# Patient Record
Sex: Male | Born: 1950 | Race: White | Hispanic: No | Smoking: Current every day smoker
Health system: Southern US, Community
[De-identification: ages and names within clinical notes are randomized; demographics above are authoritative.]

## PROBLEM LIST (undated history)

## (undated) DIAGNOSIS — I10 Essential (primary) hypertension: Secondary | ICD-10-CM

## (undated) HISTORY — PX: CARDIAC SURGERY: SHX584

---

## 2009-06-06 ENCOUNTER — Ambulatory Visit: Payer: Self-pay | Admitting: Diagnostic Radiology

## 2009-06-06 ENCOUNTER — Emergency Department (HOSPITAL_BASED_OUTPATIENT_CLINIC_OR_DEPARTMENT_OTHER): Admission: EM | Admit: 2009-06-06 | Discharge: 2009-06-06 | Payer: Self-pay | Admitting: Emergency Medicine

## 2017-02-18 ENCOUNTER — Encounter (HOSPITAL_COMMUNITY): Payer: Self-pay

## 2017-02-18 ENCOUNTER — Emergency Department (HOSPITAL_COMMUNITY)
Admission: EM | Admit: 2017-02-18 | Discharge: 2017-02-19 | Disposition: A | Discharge status: Home / Self Care | Payer: Medicare Other | Attending: Emergency Medicine | Admitting: Emergency Medicine

## 2017-02-18 DIAGNOSIS — I10 Essential (primary) hypertension: Secondary | ICD-10-CM | POA: Insufficient documentation

## 2017-02-18 DIAGNOSIS — F918 Other conduct disorders: Secondary | ICD-10-CM | POA: Diagnosis not present

## 2017-02-18 DIAGNOSIS — F17200 Nicotine dependence, unspecified, uncomplicated: Secondary | ICD-10-CM | POA: Diagnosis not present

## 2017-02-18 DIAGNOSIS — R4689 Other symptoms and signs involving appearance and behavior: Secondary | ICD-10-CM

## 2017-02-18 DIAGNOSIS — T40601A Poisoning by unspecified narcotics, accidental (unintentional), initial encounter: Secondary | ICD-10-CM

## 2017-02-18 DIAGNOSIS — T402X1A Poisoning by other opioids, accidental (unintentional), initial encounter: Secondary | ICD-10-CM | POA: Insufficient documentation

## 2017-02-18 DIAGNOSIS — R4182 Altered mental status, unspecified: Secondary | ICD-10-CM | POA: Diagnosis not present

## 2017-02-18 HISTORY — DX: Essential (primary) hypertension: I10

## 2017-02-18 LAB — CBC
HEMATOCRIT: 32.4 % — AB (ref 39.0–52.0)
HEMOGLOBIN: 11.1 g/dL — AB (ref 13.0–17.0)
MCH: 30.7 pg (ref 26.0–34.0)
MCHC: 34.3 g/dL (ref 30.0–36.0)
MCV: 89.5 fL (ref 78.0–100.0)
Platelets: 220 10*3/uL (ref 150–400)
RBC: 3.62 MIL/uL — ABNORMAL LOW (ref 4.22–5.81)
RDW: 14.2 % (ref 11.5–15.5)
WBC: 16.1 10*3/uL — ABNORMAL HIGH (ref 4.0–10.5)

## 2017-02-18 LAB — RAPID URINE DRUG SCREEN, HOSP PERFORMED
AMPHETAMINES: NOT DETECTED
BENZODIAZEPINES: POSITIVE — AB
Barbiturates: NOT DETECTED
Cocaine: NOT DETECTED
OPIATES: NOT DETECTED
TETRAHYDROCANNABINOL: NOT DETECTED

## 2017-02-18 LAB — COMPREHENSIVE METABOLIC PANEL
ALBUMIN: 3.7 g/dL (ref 3.5–5.0)
ALK PHOS: 71 U/L (ref 38–126)
ALT: 30 U/L (ref 17–63)
AST: 77 U/L — AB (ref 15–41)
Anion gap: 7 (ref 5–15)
BUN: 14 mg/dL (ref 6–20)
CALCIUM: 8.8 mg/dL — AB (ref 8.9–10.3)
CHLORIDE: 103 mmol/L (ref 101–111)
CO2: 27 mmol/L (ref 22–32)
CREATININE: 1.21 mg/dL (ref 0.61–1.24)
GFR calc non Af Amer: >60 mL/min (ref >60)
GLUCOSE: 107 mg/dL — AB (ref 65–99)
Potassium: 3.9 mmol/L (ref 3.5–5.1)
SODIUM: 137 mmol/L (ref 135–145)
Total Bilirubin: 0.3 mg/dL (ref 0.3–1.2)
Total Protein: 6.7 g/dL (ref 6.5–8.1)

## 2017-02-18 LAB — ACETAMINOPHEN LEVEL: Acetaminophen (Tylenol), Serum: <10 ug/mL — ABNORMAL LOW (ref 10–30)

## 2017-02-18 LAB — SALICYLATE LEVEL: Salicylate Lvl: <7 mg/dL (ref 2.8–30.0)

## 2017-02-18 LAB — ETHANOL: Alcohol, Ethyl (B): <5 mg/dL (ref <5)

## 2017-02-18 MED ORDER — NALOXONE HCL 2 MG/2ML IJ SOSY
1.0000 mg | PREFILLED_SYRINGE | Freq: Once | INTRAMUSCULAR | Status: AC
  Administered 2017-02-18: 1 mg via INTRAVENOUS
  Filled 2017-02-18: qty 2

## 2017-02-18 MED ORDER — SODIUM CHLORIDE 0.9 % IV SOLN
INTRAVENOUS | Status: DC
  Administered 2017-02-18: 20:00:00 via INTRAVENOUS
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
## 2017-02-18 NOTE — ED Notes (Signed)
Bed: [id number] Expected date:  Expected time:  Means of arrival:  Comments: 66 yo M/ OD

## 2017-02-18 NOTE — ED Notes (Signed)
Pt has become increased in sleepiness, and slightly lethargic, adm. 1 mg of narcan.

## 2017-02-18 NOTE — ED Notes (Addendum)
Pt is alert and verbally responsive, with a sluggish response. Pt is drowsy and intermittently in and out of sleep. [facility] Pt denies SI/HI and reports that he was not trying to kill himself, and that he had took an extra dose of percoet

## 2017-02-19 ENCOUNTER — Emergency Department (HOSPITAL_COMMUNITY): Payer: Medicare Other

## 2017-02-19 DIAGNOSIS — T402X1A Poisoning by other opioids, accidental (unintentional), initial encounter: Secondary | ICD-10-CM | POA: Diagnosis not present

## 2017-02-19 MED ORDER — NALOXONE HCL 2 MG/2ML IJ SOSY
2.0000 mg | PREFILLED_SYRINGE | Freq: Once | INTRAMUSCULAR | Status: AC
  Administered 2017-02-19: 2 mg via INTRAVENOUS
  Filled 2017-02-19: qty 2

## 2017-02-19 MED ORDER — NALOXONE HCL 2 MG/2ML IJ SOSY
PREFILLED_SYRINGE | INTRAMUSCULAR | Status: AC
  Filled 2017-02-19: qty 2

## 2017-02-19 NOTE — Discharge Instructions (Signed)
You are taking too much of your pain medication at one time. This is very dangerous and can cause of death. If you've run out of your pain medicine you will need to discuss this with your pain management doctor.
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
## 2018-03-27 IMAGING — CR DG CHEST 2V
2 series · 2 of 2 positions shown · non-contrast
Comparison: Chest radiograph performed 06/06/2009

CLINICAL DATA: Accidental medication overdose. Medical clearance.
Initial encounter.

EXAM:
CHEST  2 VIEW

[w chest lat]
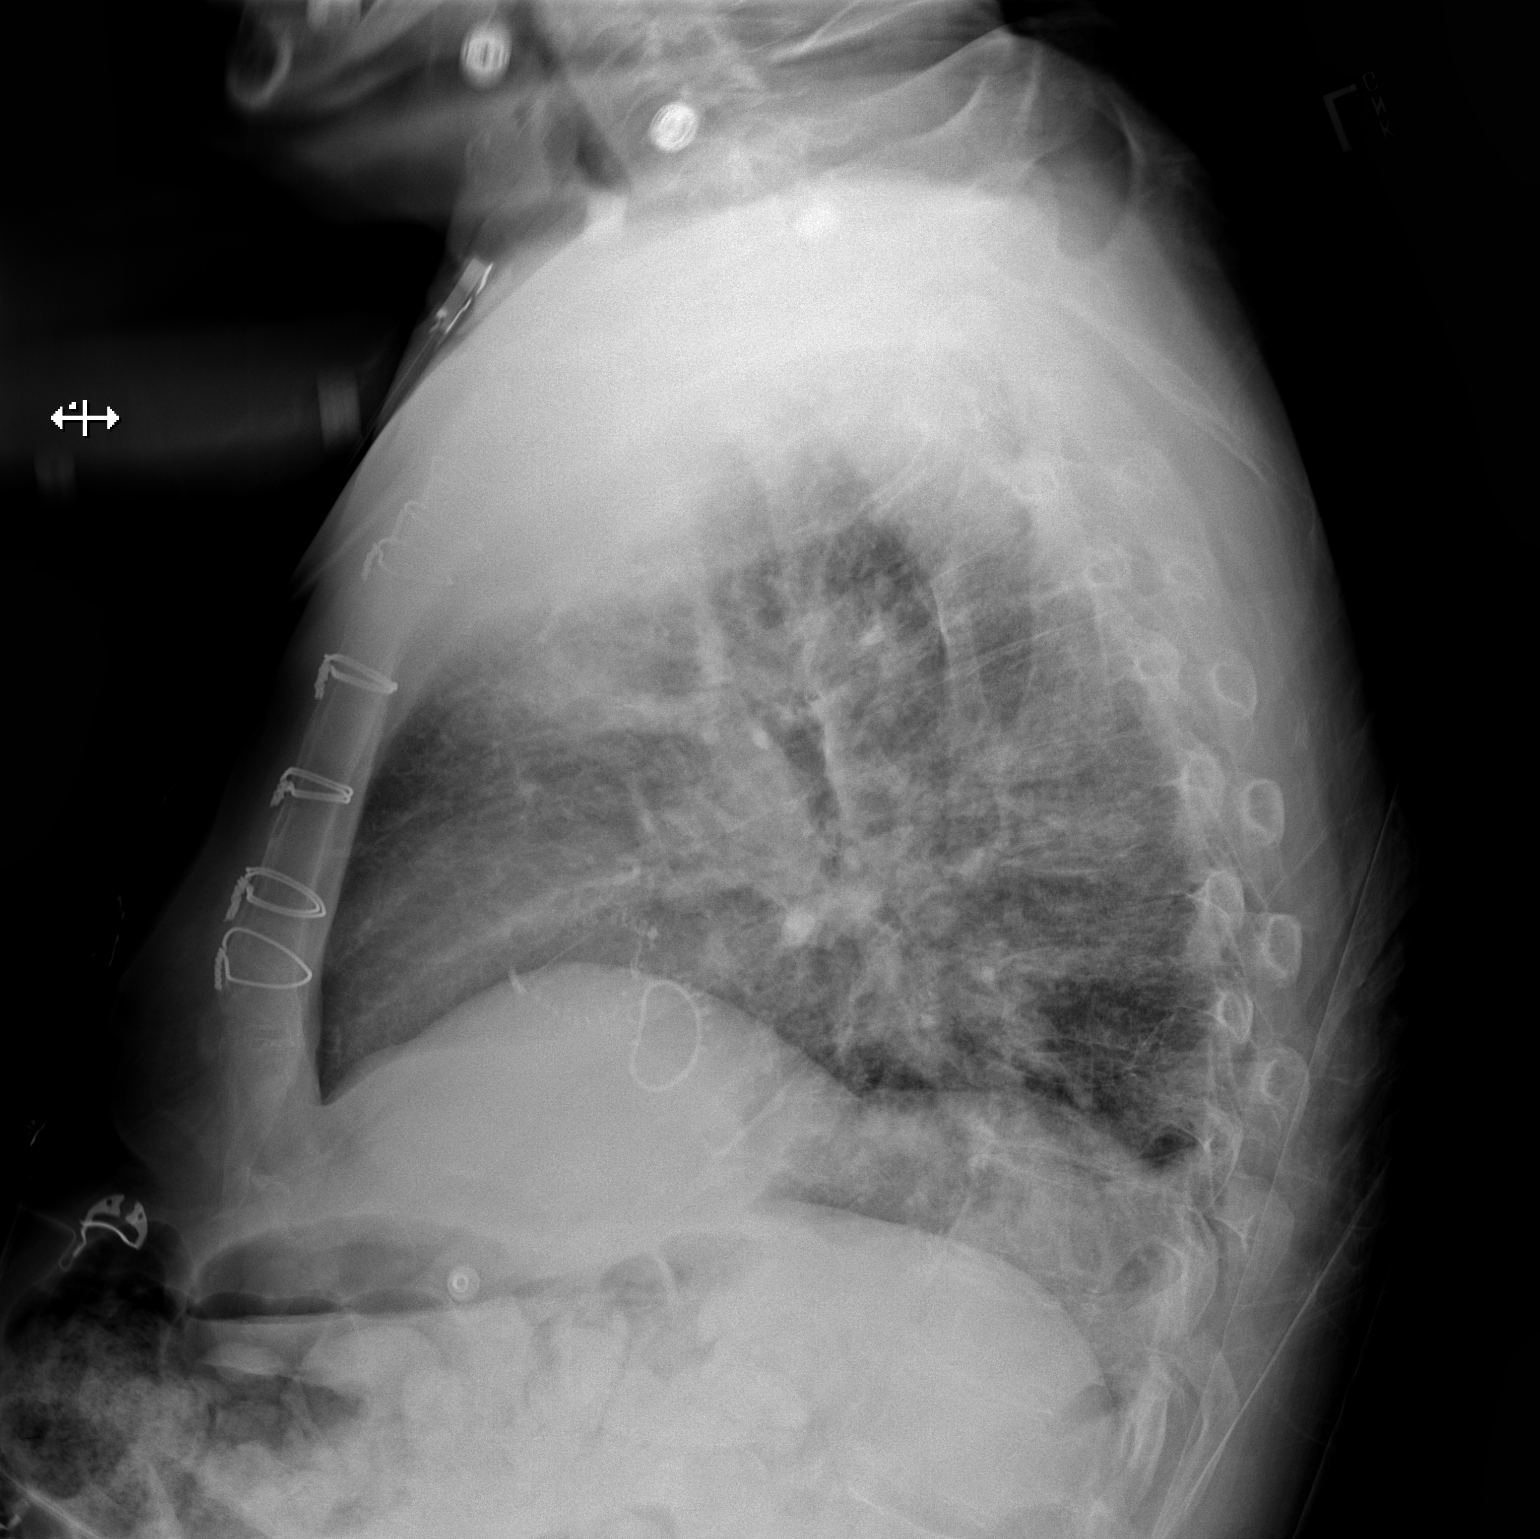

[x chest ap]
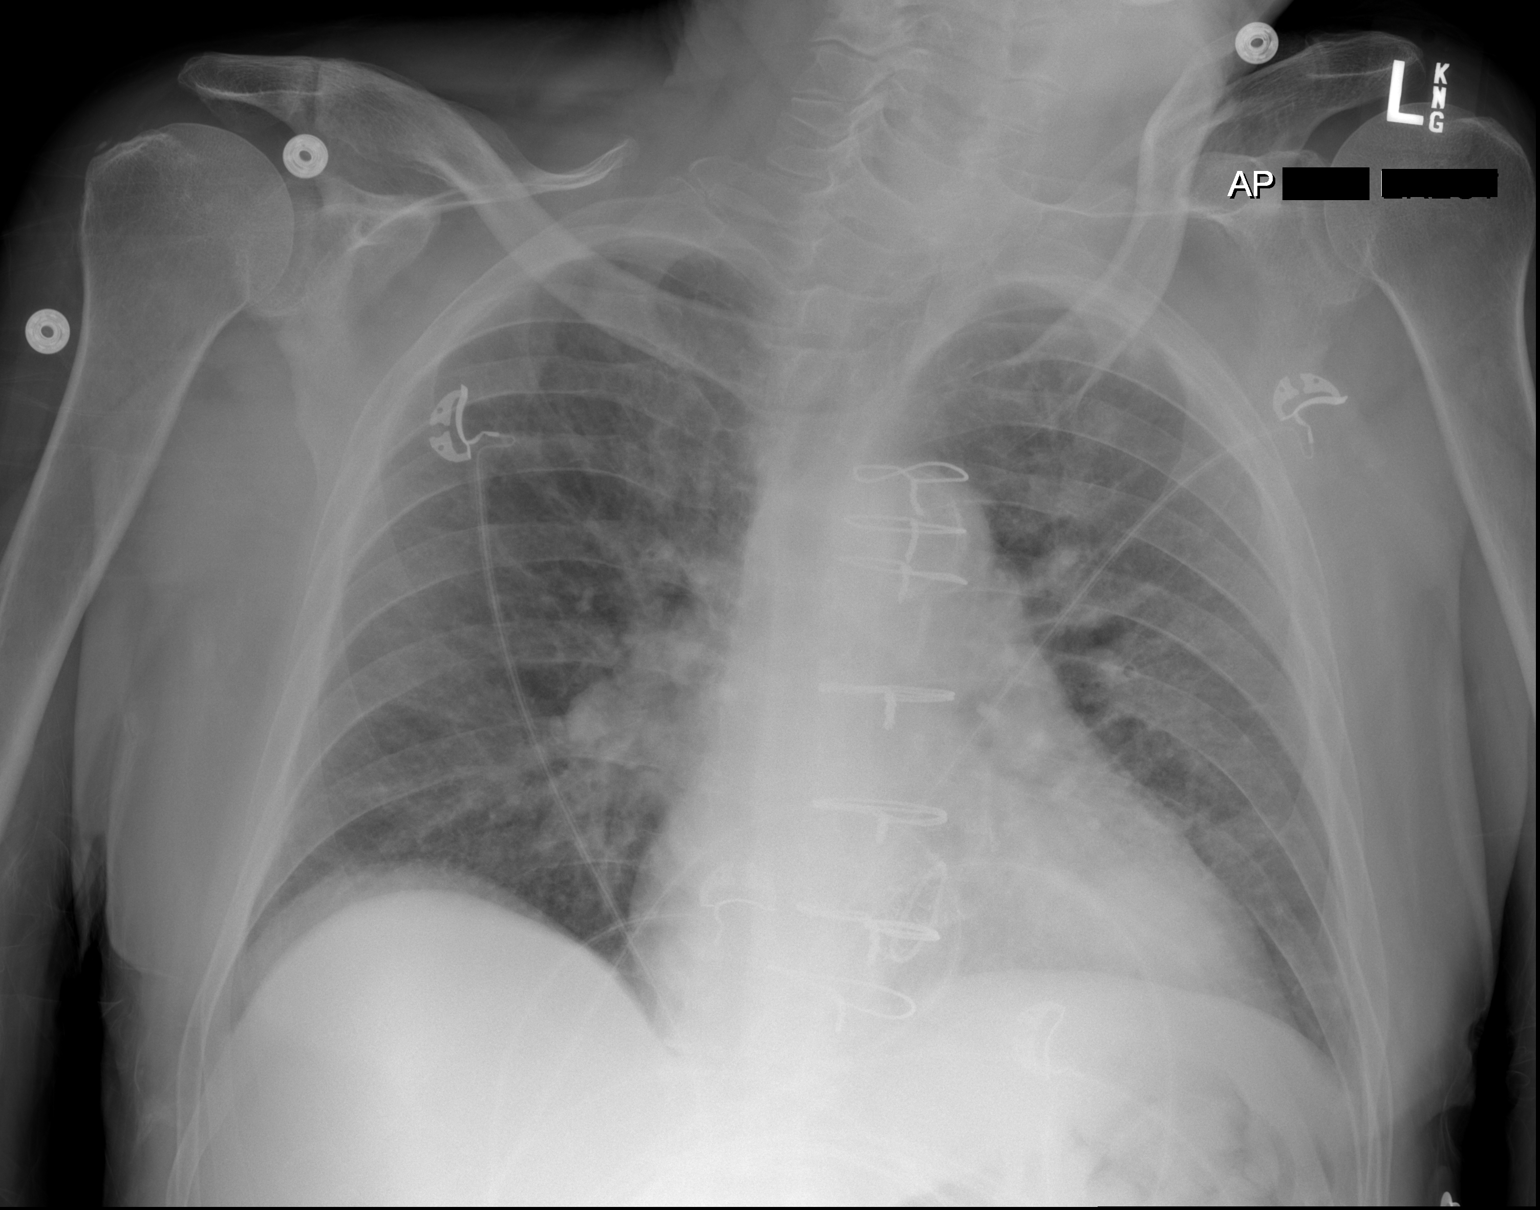

[2 of 2 positions shown; findings below may reference images not displayed]

FINDINGS: The lungs are well-aerated. Vascular congestion is noted. Increased
interstitial markings may reflect transient interstitial edema.
There is no evidence of pleural effusion or pneumothorax.

The heart is normal in size; the patient is status post median
sternotomy. A valve replacement is noted. No acute osseous
abnormalities are seen.
IMPRESSION: Vascular congestion. Increased interstitial markings may reflect
transient interstitial edema.

## 2023-12-30 DIAGNOSIS — N179 Acute kidney failure, unspecified: Secondary | ICD-10-CM | POA: Diagnosis not present

## 2023-12-30 DIAGNOSIS — I429 Cardiomyopathy, unspecified: Secondary | ICD-10-CM | POA: Diagnosis not present

## 2023-12-30 DIAGNOSIS — Z951 Presence of aortocoronary bypass graft: Secondary | ICD-10-CM | POA: Diagnosis not present

## 2023-12-30 DIAGNOSIS — R64 Cachexia: Secondary | ICD-10-CM | POA: Diagnosis not present

## 2023-12-30 DIAGNOSIS — R6889 Other general symptoms and signs: Secondary | ICD-10-CM | POA: Diagnosis not present

## 2023-12-30 DIAGNOSIS — R627 Adult failure to thrive: Secondary | ICD-10-CM | POA: Diagnosis not present

## 2023-12-30 DIAGNOSIS — Z7189 Other specified counseling: Secondary | ICD-10-CM | POA: Diagnosis not present

## 2023-12-30 DIAGNOSIS — I5043 Acute on chronic combined systolic (congestive) and diastolic (congestive) heart failure: Secondary | ICD-10-CM | POA: Diagnosis not present

## 2023-12-30 DIAGNOSIS — Z743 Need for continuous supervision: Secondary | ICD-10-CM | POA: Diagnosis not present

## 2023-12-30 DIAGNOSIS — I959 Hypotension, unspecified: Secondary | ICD-10-CM | POA: Diagnosis not present

## 2023-12-30 DIAGNOSIS — S0990XA Unspecified injury of head, initial encounter: Secondary | ICD-10-CM | POA: Diagnosis not present

## 2023-12-30 DIAGNOSIS — E8729 Other acidosis: Secondary | ICD-10-CM | POA: Diagnosis not present

## 2023-12-30 DIAGNOSIS — I950 Idiopathic hypotension: Secondary | ICD-10-CM | POA: Diagnosis not present

## 2023-12-30 DIAGNOSIS — I5020 Unspecified systolic (congestive) heart failure: Secondary | ICD-10-CM | POA: Diagnosis not present

## 2023-12-30 DIAGNOSIS — I130 Hypertensive heart and chronic kidney disease with heart failure and stage 1 through stage 4 chronic kidney disease, or unspecified chronic kidney disease: Secondary | ICD-10-CM | POA: Diagnosis not present

## 2023-12-30 DIAGNOSIS — Z952 Presence of prosthetic heart valve: Secondary | ICD-10-CM | POA: Diagnosis not present

## 2023-12-30 DIAGNOSIS — Z681 Body mass index (BMI) 19 or less, adult: Secondary | ICD-10-CM | POA: Diagnosis not present

## 2023-12-30 DIAGNOSIS — D696 Thrombocytopenia, unspecified: Secondary | ICD-10-CM | POA: Diagnosis not present

## 2023-12-30 DIAGNOSIS — Z66 Do not resuscitate: Secondary | ICD-10-CM | POA: Diagnosis not present

## 2023-12-30 DIAGNOSIS — I480 Paroxysmal atrial fibrillation: Secondary | ICD-10-CM | POA: Diagnosis not present

## 2023-12-30 DIAGNOSIS — R410 Disorientation, unspecified: Secondary | ICD-10-CM | POA: Diagnosis not present

## 2023-12-30 DIAGNOSIS — I4891 Unspecified atrial fibrillation: Secondary | ICD-10-CM | POA: Diagnosis not present

## 2023-12-30 DIAGNOSIS — R110 Nausea: Secondary | ICD-10-CM | POA: Diagnosis not present

## 2023-12-30 DIAGNOSIS — I509 Heart failure, unspecified: Secondary | ICD-10-CM | POA: Diagnosis not present

## 2023-12-30 DIAGNOSIS — I499 Cardiac arrhythmia, unspecified: Secondary | ICD-10-CM | POA: Diagnosis not present

## 2023-12-30 DIAGNOSIS — Z953 Presence of xenogenic heart valve: Secondary | ICD-10-CM | POA: Diagnosis not present

## 2023-12-30 DIAGNOSIS — R6510 Systemic inflammatory response syndrome (SIRS) of non-infectious origin without acute organ dysfunction: Secondary | ICD-10-CM | POA: Diagnosis not present

## 2023-12-30 DIAGNOSIS — I081 Rheumatic disorders of both mitral and tricuspid valves: Secondary | ICD-10-CM | POA: Diagnosis not present

## 2023-12-30 DIAGNOSIS — N1831 Chronic kidney disease, stage 3a: Secondary | ICD-10-CM | POA: Diagnosis not present

## 2023-12-30 DIAGNOSIS — D500 Iron deficiency anemia secondary to blood loss (chronic): Secondary | ICD-10-CM | POA: Diagnosis not present

## 2023-12-30 DIAGNOSIS — I2510 Atherosclerotic heart disease of native coronary artery without angina pectoris: Secondary | ICD-10-CM | POA: Diagnosis not present

## 2023-12-30 DIAGNOSIS — D539 Nutritional anemia, unspecified: Secondary | ICD-10-CM | POA: Diagnosis not present

## 2023-12-30 DIAGNOSIS — R531 Weakness: Secondary | ICD-10-CM | POA: Diagnosis not present

## 2023-12-30 DIAGNOSIS — Z1152 Encounter for screening for COVID-19: Secondary | ICD-10-CM | POA: Diagnosis not present

## 2023-12-30 DIAGNOSIS — I340 Nonrheumatic mitral (valve) insufficiency: Secondary | ICD-10-CM | POA: Diagnosis not present

## 2023-12-30 DIAGNOSIS — Z9889 Other specified postprocedural states: Secondary | ICD-10-CM | POA: Diagnosis not present

## 2023-12-30 DIAGNOSIS — Z5919 Other inadequate housing: Secondary | ICD-10-CM | POA: Diagnosis not present

## 2023-12-30 DIAGNOSIS — Z515 Encounter for palliative care: Secondary | ICD-10-CM | POA: Diagnosis not present

## 2023-12-30 DIAGNOSIS — E43 Unspecified severe protein-calorie malnutrition: Secondary | ICD-10-CM | POA: Diagnosis not present

## 2023-12-30 DIAGNOSIS — I482 Chronic atrial fibrillation, unspecified: Secondary | ICD-10-CM | POA: Diagnosis not present

## 2023-12-30 DIAGNOSIS — E876 Hypokalemia: Secondary | ICD-10-CM | POA: Diagnosis not present

## 2023-12-30 DIAGNOSIS — Z8679 Personal history of other diseases of the circulatory system: Secondary | ICD-10-CM | POA: Diagnosis not present

## 2023-12-31 DIAGNOSIS — Z8679 Personal history of other diseases of the circulatory system: Secondary | ICD-10-CM | POA: Diagnosis not present

## 2023-12-31 DIAGNOSIS — E43 Unspecified severe protein-calorie malnutrition: Secondary | ICD-10-CM | POA: Diagnosis not present

## 2023-12-31 DIAGNOSIS — N179 Acute kidney failure, unspecified: Secondary | ICD-10-CM | POA: Diagnosis not present

## 2023-12-31 DIAGNOSIS — D696 Thrombocytopenia, unspecified: Secondary | ICD-10-CM | POA: Diagnosis not present

## 2023-12-31 DIAGNOSIS — Z9889 Other specified postprocedural states: Secondary | ICD-10-CM | POA: Diagnosis not present

## 2023-12-31 DIAGNOSIS — R6510 Systemic inflammatory response syndrome (SIRS) of non-infectious origin without acute organ dysfunction: Secondary | ICD-10-CM | POA: Diagnosis not present

## 2023-12-31 DIAGNOSIS — Z953 Presence of xenogenic heart valve: Secondary | ICD-10-CM | POA: Diagnosis not present

## 2023-12-31 DIAGNOSIS — I081 Rheumatic disorders of both mitral and tricuspid valves: Secondary | ICD-10-CM | POA: Diagnosis not present

## 2023-12-31 DIAGNOSIS — K8020 Calculus of gallbladder without cholecystitis without obstruction: Secondary | ICD-10-CM | POA: Diagnosis not present

## 2023-12-31 DIAGNOSIS — E876 Hypokalemia: Secondary | ICD-10-CM | POA: Diagnosis not present

## 2023-12-31 DIAGNOSIS — K5900 Constipation, unspecified: Secondary | ICD-10-CM | POA: Diagnosis not present

## 2023-12-31 DIAGNOSIS — E8729 Other acidosis: Secondary | ICD-10-CM | POA: Diagnosis not present

## 2023-12-31 DIAGNOSIS — I4891 Unspecified atrial fibrillation: Secondary | ICD-10-CM | POA: Diagnosis not present

## 2024-01-01 DIAGNOSIS — N179 Acute kidney failure, unspecified: Secondary | ICD-10-CM | POA: Diagnosis not present

## 2024-01-01 DIAGNOSIS — R6510 Systemic inflammatory response syndrome (SIRS) of non-infectious origin without acute organ dysfunction: Secondary | ICD-10-CM | POA: Diagnosis not present

## 2024-01-01 DIAGNOSIS — E876 Hypokalemia: Secondary | ICD-10-CM | POA: Diagnosis not present

## 2024-01-01 DIAGNOSIS — Z8679 Personal history of other diseases of the circulatory system: Secondary | ICD-10-CM | POA: Diagnosis not present

## 2024-01-01 DIAGNOSIS — E43 Unspecified severe protein-calorie malnutrition: Secondary | ICD-10-CM | POA: Diagnosis not present

## 2024-01-01 DIAGNOSIS — D696 Thrombocytopenia, unspecified: Secondary | ICD-10-CM | POA: Diagnosis not present

## 2024-01-01 DIAGNOSIS — E8729 Other acidosis: Secondary | ICD-10-CM | POA: Diagnosis not present

## 2024-01-01 DIAGNOSIS — I4891 Unspecified atrial fibrillation: Secondary | ICD-10-CM | POA: Diagnosis not present

## 2024-01-02 DIAGNOSIS — E8729 Other acidosis: Secondary | ICD-10-CM | POA: Diagnosis not present

## 2024-01-02 DIAGNOSIS — Z8679 Personal history of other diseases of the circulatory system: Secondary | ICD-10-CM | POA: Diagnosis not present

## 2024-01-02 DIAGNOSIS — R6510 Systemic inflammatory response syndrome (SIRS) of non-infectious origin without acute organ dysfunction: Secondary | ICD-10-CM | POA: Diagnosis not present

## 2024-01-02 DIAGNOSIS — I4891 Unspecified atrial fibrillation: Secondary | ICD-10-CM | POA: Diagnosis not present

## 2024-01-02 DIAGNOSIS — N179 Acute kidney failure, unspecified: Secondary | ICD-10-CM | POA: Diagnosis not present

## 2024-01-03 DIAGNOSIS — Z8679 Personal history of other diseases of the circulatory system: Secondary | ICD-10-CM | POA: Diagnosis not present

## 2024-01-03 DIAGNOSIS — I4891 Unspecified atrial fibrillation: Secondary | ICD-10-CM | POA: Diagnosis not present

## 2024-01-03 DIAGNOSIS — N179 Acute kidney failure, unspecified: Secondary | ICD-10-CM | POA: Diagnosis not present

## 2024-01-03 DIAGNOSIS — R6510 Systemic inflammatory response syndrome (SIRS) of non-infectious origin without acute organ dysfunction: Secondary | ICD-10-CM | POA: Diagnosis not present

## 2024-01-03 DIAGNOSIS — E8729 Other acidosis: Secondary | ICD-10-CM | POA: Diagnosis not present

## 2024-01-04 DIAGNOSIS — Z8679 Personal history of other diseases of the circulatory system: Secondary | ICD-10-CM | POA: Diagnosis not present

## 2024-01-04 DIAGNOSIS — I4891 Unspecified atrial fibrillation: Secondary | ICD-10-CM | POA: Diagnosis not present

## 2024-01-04 DIAGNOSIS — N179 Acute kidney failure, unspecified: Secondary | ICD-10-CM | POA: Diagnosis not present

## 2024-01-04 DIAGNOSIS — R6510 Systemic inflammatory response syndrome (SIRS) of non-infectious origin without acute organ dysfunction: Secondary | ICD-10-CM | POA: Diagnosis not present

## 2024-01-04 DIAGNOSIS — E8729 Other acidosis: Secondary | ICD-10-CM | POA: Diagnosis not present

## 2024-01-05 DIAGNOSIS — R6510 Systemic inflammatory response syndrome (SIRS) of non-infectious origin without acute organ dysfunction: Secondary | ICD-10-CM | POA: Diagnosis not present

## 2024-01-05 DIAGNOSIS — I950 Idiopathic hypotension: Secondary | ICD-10-CM | POA: Diagnosis not present

## 2024-01-06 DIAGNOSIS — E43 Unspecified severe protein-calorie malnutrition: Secondary | ICD-10-CM | POA: Diagnosis not present

## 2024-01-07 DIAGNOSIS — N179 Acute kidney failure, unspecified: Secondary | ICD-10-CM | POA: Diagnosis not present

## 2024-01-07 DIAGNOSIS — I4891 Unspecified atrial fibrillation: Secondary | ICD-10-CM | POA: Diagnosis not present

## 2024-01-08 DIAGNOSIS — N179 Acute kidney failure, unspecified: Secondary | ICD-10-CM | POA: Diagnosis not present

## 2024-01-09 DIAGNOSIS — Z8679 Personal history of other diseases of the circulatory system: Secondary | ICD-10-CM | POA: Diagnosis not present

## 2024-01-09 DIAGNOSIS — D696 Thrombocytopenia, unspecified: Secondary | ICD-10-CM | POA: Diagnosis not present

## 2024-01-09 DIAGNOSIS — E43 Unspecified severe protein-calorie malnutrition: Secondary | ICD-10-CM | POA: Diagnosis not present

## 2024-01-09 DIAGNOSIS — I5020 Unspecified systolic (congestive) heart failure: Secondary | ICD-10-CM | POA: Diagnosis not present

## 2024-01-09 DIAGNOSIS — D539 Nutritional anemia, unspecified: Secondary | ICD-10-CM | POA: Diagnosis not present

## 2024-01-09 DIAGNOSIS — I480 Paroxysmal atrial fibrillation: Secondary | ICD-10-CM | POA: Diagnosis not present

## 2024-01-10 DIAGNOSIS — R6510 Systemic inflammatory response syndrome (SIRS) of non-infectious origin without acute organ dysfunction: Secondary | ICD-10-CM | POA: Diagnosis not present

## 2024-01-10 DIAGNOSIS — I5020 Unspecified systolic (congestive) heart failure: Secondary | ICD-10-CM | POA: Diagnosis not present

## 2024-01-10 DIAGNOSIS — I4891 Unspecified atrial fibrillation: Secondary | ICD-10-CM | POA: Diagnosis not present

## 2024-01-10 DIAGNOSIS — Z8679 Personal history of other diseases of the circulatory system: Secondary | ICD-10-CM | POA: Diagnosis not present

## 2024-01-10 DIAGNOSIS — I480 Paroxysmal atrial fibrillation: Secondary | ICD-10-CM | POA: Diagnosis not present

## 2024-01-10 DIAGNOSIS — E43 Unspecified severe protein-calorie malnutrition: Secondary | ICD-10-CM | POA: Diagnosis not present

## 2024-01-10 DIAGNOSIS — D696 Thrombocytopenia, unspecified: Secondary | ICD-10-CM | POA: Diagnosis not present

## 2024-01-10 DIAGNOSIS — Z7189 Other specified counseling: Secondary | ICD-10-CM | POA: Diagnosis not present

## 2024-01-11 DIAGNOSIS — R410 Disorientation, unspecified: Secondary | ICD-10-CM | POA: Diagnosis not present

## 2024-01-11 DIAGNOSIS — N179 Acute kidney failure, unspecified: Secondary | ICD-10-CM | POA: Diagnosis not present

## 2024-01-11 DIAGNOSIS — I480 Paroxysmal atrial fibrillation: Secondary | ICD-10-CM | POA: Diagnosis not present

## 2024-01-11 DIAGNOSIS — I5020 Unspecified systolic (congestive) heart failure: Secondary | ICD-10-CM | POA: Diagnosis not present

## 2024-01-11 DIAGNOSIS — Z8679 Personal history of other diseases of the circulatory system: Secondary | ICD-10-CM | POA: Diagnosis not present

## 2024-01-11 DIAGNOSIS — I4891 Unspecified atrial fibrillation: Secondary | ICD-10-CM | POA: Diagnosis not present

## 2024-01-11 DIAGNOSIS — E43 Unspecified severe protein-calorie malnutrition: Secondary | ICD-10-CM | POA: Diagnosis not present

## 2024-01-11 DIAGNOSIS — Z515 Encounter for palliative care: Secondary | ICD-10-CM | POA: Diagnosis not present

## 2024-01-11 DIAGNOSIS — D696 Thrombocytopenia, unspecified: Secondary | ICD-10-CM | POA: Diagnosis not present

## 2024-01-12 DIAGNOSIS — I959 Hypotension, unspecified: Secondary | ICD-10-CM | POA: Diagnosis not present

## 2024-01-12 DIAGNOSIS — R6510 Systemic inflammatory response syndrome (SIRS) of non-infectious origin without acute organ dysfunction: Secondary | ICD-10-CM | POA: Diagnosis not present

## 2024-01-13 DIAGNOSIS — N179 Acute kidney failure, unspecified: Secondary | ICD-10-CM | POA: Diagnosis not present

## 2024-01-13 DIAGNOSIS — I4891 Unspecified atrial fibrillation: Secondary | ICD-10-CM | POA: Diagnosis not present

## 2024-08-13 ENCOUNTER — Telehealth: Payer: Self-pay

## 2024-08-13 NOTE — Telephone Encounter (Signed)
 Tried to call patient and get scheduled with the office. No Vm available.
